# Patient Record
Sex: Female | Born: 1965 | Race: White | Hispanic: No | Marital: Single | Smoking: Never smoker
Health system: Southern US, Community
[De-identification: ages and names within clinical notes are randomized; demographics above are authoritative.]

## PROBLEM LIST (undated history)

## (undated) DIAGNOSIS — M25511 Pain in right shoulder: Secondary | ICD-10-CM

## (undated) DIAGNOSIS — T7840XA Allergy, unspecified, initial encounter: Secondary | ICD-10-CM

## (undated) HISTORY — PX: SHOULDER SURGERY: SHX246

## (undated) HISTORY — PX: FRACTURE SURGERY: SHX138

## (undated) HISTORY — DX: Pain in right shoulder: M25.511

## (undated) HISTORY — DX: Allergy, unspecified, initial encounter: T78.40XA

## (undated) HISTORY — PX: EYE SURGERY: SHX253

## (undated) HISTORY — PX: TUBAL LIGATION: SHX77

---

## 1998-11-03 ENCOUNTER — Observation Stay (HOSPITAL_COMMUNITY): Admission: RE | Admit: 1998-11-03 | Discharge: 1998-11-04 | Payer: Self-pay | Admitting: Gynecology

## 2001-04-03 ENCOUNTER — Other Ambulatory Visit: Admission: RE | Admit: 2001-04-03 | Discharge: 2001-04-03 | Payer: Self-pay | Admitting: Obstetrics and Gynecology

## 2002-05-22 ENCOUNTER — Encounter: Payer: Self-pay | Admitting: Emergency Medicine

## 2002-05-22 ENCOUNTER — Emergency Department (HOSPITAL_COMMUNITY): Admission: EM | Admit: 2002-05-22 | Discharge: 2002-05-22 | Payer: Self-pay | Admitting: Emergency Medicine

## 2002-08-24 ENCOUNTER — Ambulatory Visit (HOSPITAL_BASED_OUTPATIENT_CLINIC_OR_DEPARTMENT_OTHER): Admission: RE | Admit: 2002-08-24 | Discharge: 2002-08-24 | Payer: Self-pay | Admitting: Orthopaedic Surgery

## 2002-10-05 ENCOUNTER — Ambulatory Visit (HOSPITAL_BASED_OUTPATIENT_CLINIC_OR_DEPARTMENT_OTHER): Admission: RE | Admit: 2002-10-05 | Discharge: 2002-10-05 | Payer: Self-pay | Admitting: Orthopaedic Surgery

## 2004-04-15 ENCOUNTER — Inpatient Hospital Stay (HOSPITAL_COMMUNITY): Admission: EM | Admit: 2004-04-15 | Discharge: 2004-04-16 | Payer: Self-pay | Admitting: Emergency Medicine

## 2004-04-16 ENCOUNTER — Encounter (INDEPENDENT_AMBULATORY_CARE_PROVIDER_SITE_OTHER): Payer: Self-pay | Admitting: *Deleted

## 2004-06-06 ENCOUNTER — Ambulatory Visit: Payer: Self-pay | Admitting: Internal Medicine

## 2004-08-28 ENCOUNTER — Ambulatory Visit: Payer: Self-pay | Admitting: Family Medicine

## 2004-08-31 ENCOUNTER — Ambulatory Visit: Payer: Self-pay | Admitting: Family Medicine

## 2004-09-25 ENCOUNTER — Ambulatory Visit: Payer: Self-pay | Admitting: Family Medicine

## 2004-09-25 ENCOUNTER — Encounter (INDEPENDENT_AMBULATORY_CARE_PROVIDER_SITE_OTHER): Payer: Self-pay | Admitting: Internal Medicine

## 2004-09-25 ENCOUNTER — Other Ambulatory Visit: Admission: RE | Admit: 2004-09-25 | Discharge: 2004-10-10 | Payer: Self-pay | Admitting: Internal Medicine

## 2004-09-25 LAB — CONVERTED CEMR LAB: Pap Smear: NORMAL

## 2004-09-27 ENCOUNTER — Ambulatory Visit: Payer: Self-pay | Admitting: Family Medicine

## 2004-11-06 ENCOUNTER — Ambulatory Visit: Payer: Self-pay | Admitting: Family Medicine

## 2004-12-18 ENCOUNTER — Ambulatory Visit: Payer: Self-pay | Admitting: Family Medicine

## 2005-02-08 ENCOUNTER — Encounter (INDEPENDENT_AMBULATORY_CARE_PROVIDER_SITE_OTHER): Payer: Self-pay | Admitting: *Deleted

## 2005-02-08 ENCOUNTER — Ambulatory Visit (HOSPITAL_COMMUNITY): Admission: RE | Admit: 2005-02-08 | Discharge: 2005-02-08 | Payer: Self-pay | Admitting: Obstetrics and Gynecology

## 2005-04-05 ENCOUNTER — Ambulatory Visit: Payer: Self-pay | Admitting: Family Medicine

## 2005-07-02 ENCOUNTER — Ambulatory Visit: Payer: Self-pay | Admitting: Family Medicine

## 2005-07-18 ENCOUNTER — Ambulatory Visit: Payer: Self-pay | Admitting: Family Medicine

## 2006-07-22 HISTORY — PX: CHOLECYSTECTOMY: SHX55

## 2007-02-10 ENCOUNTER — Encounter (INDEPENDENT_AMBULATORY_CARE_PROVIDER_SITE_OTHER): Payer: Self-pay | Admitting: Internal Medicine

## 2007-02-10 DIAGNOSIS — N809 Endometriosis, unspecified: Secondary | ICD-10-CM | POA: Insufficient documentation

## 2007-02-13 ENCOUNTER — Ambulatory Visit: Payer: Self-pay | Admitting: Family Medicine

## 2007-02-13 DIAGNOSIS — F411 Generalized anxiety disorder: Secondary | ICD-10-CM | POA: Insufficient documentation

## 2007-03-20 ENCOUNTER — Ambulatory Visit: Payer: Self-pay | Admitting: Internal Medicine

## 2007-05-21 ENCOUNTER — Ambulatory Visit: Payer: Self-pay | Admitting: Family Medicine

## 2007-05-27 ENCOUNTER — Encounter (INDEPENDENT_AMBULATORY_CARE_PROVIDER_SITE_OTHER): Payer: Self-pay | Admitting: Internal Medicine

## 2007-05-27 ENCOUNTER — Telehealth (INDEPENDENT_AMBULATORY_CARE_PROVIDER_SITE_OTHER): Payer: Self-pay | Admitting: Internal Medicine

## 2007-08-06 ENCOUNTER — Ambulatory Visit: Payer: Self-pay | Admitting: Family Medicine

## 2007-08-06 DIAGNOSIS — R519 Headache, unspecified: Secondary | ICD-10-CM | POA: Insufficient documentation

## 2007-08-06 DIAGNOSIS — R51 Headache: Secondary | ICD-10-CM

## 2007-08-20 ENCOUNTER — Ambulatory Visit: Payer: Self-pay | Admitting: Family Medicine

## 2007-09-07 ENCOUNTER — Telehealth (INDEPENDENT_AMBULATORY_CARE_PROVIDER_SITE_OTHER): Payer: Self-pay | Admitting: Internal Medicine

## 2007-11-03 ENCOUNTER — Telehealth (INDEPENDENT_AMBULATORY_CARE_PROVIDER_SITE_OTHER): Payer: Self-pay | Admitting: Internal Medicine

## 2008-01-05 ENCOUNTER — Ambulatory Visit: Payer: Self-pay | Admitting: Family Medicine

## 2008-01-07 ENCOUNTER — Telehealth (INDEPENDENT_AMBULATORY_CARE_PROVIDER_SITE_OTHER): Payer: Self-pay | Admitting: Internal Medicine

## 2008-01-21 ENCOUNTER — Ambulatory Visit: Payer: Self-pay | Admitting: Internal Medicine

## 2008-01-21 ENCOUNTER — Telehealth (INDEPENDENT_AMBULATORY_CARE_PROVIDER_SITE_OTHER): Payer: Self-pay | Admitting: Internal Medicine

## 2008-11-16 ENCOUNTER — Ambulatory Visit: Payer: Self-pay | Admitting: Family Medicine

## 2008-11-16 ENCOUNTER — Encounter (INDEPENDENT_AMBULATORY_CARE_PROVIDER_SITE_OTHER): Payer: Self-pay | Admitting: Internal Medicine

## 2008-12-21 ENCOUNTER — Encounter (INDEPENDENT_AMBULATORY_CARE_PROVIDER_SITE_OTHER): Payer: Self-pay | Admitting: Internal Medicine

## 2009-01-11 ENCOUNTER — Encounter (INDEPENDENT_AMBULATORY_CARE_PROVIDER_SITE_OTHER): Payer: Self-pay | Admitting: Internal Medicine

## 2010-03-07 ENCOUNTER — Emergency Department (HOSPITAL_COMMUNITY): Admission: EM | Admit: 2010-03-07 | Discharge: 2010-03-07 | Payer: Self-pay | Admitting: Emergency Medicine

## 2010-03-16 ENCOUNTER — Ambulatory Visit: Payer: Self-pay | Admitting: Internal Medicine

## 2010-03-16 DIAGNOSIS — S0100XA Unspecified open wound of scalp, initial encounter: Secondary | ICD-10-CM | POA: Insufficient documentation

## 2010-03-16 DIAGNOSIS — S8010XA Contusion of unspecified lower leg, initial encounter: Secondary | ICD-10-CM | POA: Insufficient documentation

## 2010-04-17 ENCOUNTER — Ambulatory Visit: Payer: Self-pay | Admitting: Internal Medicine

## 2010-04-17 DIAGNOSIS — R42 Dizziness and giddiness: Secondary | ICD-10-CM | POA: Insufficient documentation
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
## 2010-12-07 NOTE — Op Note (Signed)
   NAME:  Leslie Mcguire, Leslie Mcguire                       ACCOUNT NO.:  [id number]   MEDICAL RECORD NO.:  [id number]                   PATIENT TYPE:  AMB   LOCATION:  DSC                                  FACILITY:  [facility]   PHYSICIAN:  Lubertha Basque. Jerl Santos, M.D.             DATE OF BIRTH:  05-01-1966   DATE OF PROCEDURE:  10/05/2002  DATE OF DISCHARGE:                                 OPERATIVE REPORT   PREOPERATIVE DIAGNOSES:  Left shoulder adhesive capsulitis.   POSTOPERATIVE DIAGNOSES:  Left shoulder adhesive capsulitis.   OPERATION PERFORMED:  Left shoulder closed manipulation.   SURGEON:  Lubertha Basque. Jerl Santos, M.D.   ASSISTANT:  Prince Rome, P.A.   ANESTHESIA:  General mask and block.   INDICATIONS FOR PROCEDURE:  The patient is a 45 year old woman sometime out  from an open partial claviculectomy.  She has done well in terms of  eliminating her pain at the surgical site, but unfortunately has been left  with a very stiff shoulder.  This has not improved despite physical therapy  and she is offered a closed manipulation at this point.  The procedure was  discussed with the patient and informed operative consent was obtained after  discussion of possible complications of reaction to anesthesia and fracture.   DESCRIPTION OF PROCEDURE:  The patient was taken to the operating suite  where she was given a mask anesthetic.  She was also given a block in the  preanesthesia area.  I then performed a manipulation.  I took her forward  flexion from about 90 to 150 with several audible pops along the way.  Her  external rotation improved from 0 to about 50 degrees and internal rotation  was also improved.  I then prepped her shoulder and we placed a small  injection of Depo-Medrol and lidocaine.  She tolerated this well and a band-  aid was applied.   ESTIMATED BLOOD LOSS:  None.   Intraoperative fluids can be obtained from anesthesia records.   DISPOSITION:  The patient was taken to  the recovery room in stable  condition.  Plans were for the patient  to go home the same day and to  follow up in the office in less than a week.  I will contact her by phone  tonight.                                                 Lubertha Basque Jerl Santos, M.D.    PGD/MEDQ  D:  10/05/2002  T:  10/05/2002  Job:  [id number]

## 2010-12-07 NOTE — H&P (Signed)
Leslie Mcguire             ACCOUNT NO.:  [id number]   MEDICAL RECORD NO.:  [id number]          PATIENT TYPE:  AMB   LOCATION:  SDC                           FACILITY:  [facility]   PHYSICIAN:  Lenoard Aden, M.D.DATE OF BIRTH:  [birth year]   DATE OF ADMISSION:  DATE OF DISCHARGE:                                HISTORY & PHYSICAL   A 45 year old white female, G0, P0, with history of menorrhagia,  dysmenorrhea.  She has a history of endometriosis status post laparoscopic  right salpingo-oophorectomy and tubal ligation.  She wishes to proceed with  definitive evaluation.  She has a history of endometriosis and surgical  intervention as previously noted.  No pregnancies.  She is a nonsmoker,  nondrinker, denies domestic or physical violence.  She also has a history of  cataract surgery.  She has a family history of hypertension, heart disease,  and lung cancer.   ALLERGIES:  PENICILLIN.   MEDICATIONS:  Multivitamin.   PHYSICAL EXAMINATION:  GENERAL:  She is a well-developed, well-nourished,  white female in no acute distress.  HEENT:  Normal.  LUNGS:  Clear.  HEART:  Regular rate and rhythm.  ABDOMEN:  Soft, nontender.  PELVIC:  An anteflexed uterus and no adnexal masses.   IMPRESSION:  Menorrhagia, dysmenorrhea for definitive therapy.   PLAN:  Proceed with a diagnostic hysteroscopy, possible resectoscope, and  endometrial ablation.  Risks of anesthesia, infection, bleeding, uterine  perforation, possible need for open procedure noted.  The patient  acknowledges and wishes to proceed.       RJT/MEDQ  D:  02/07/2005  T:  02/07/2005  Job:  [id number]

## 2010-12-07 NOTE — Op Note (Signed)
NAMERHAYA, COALE NO.:  [id number]   MEDICAL RECORD NO.:  [id number]          PATIENT TYPE:  INP   LOCATION:  0467                         FACILITY:  [facility]   PHYSICIAN:  Ollen Gross. Vernell Morgans, M.D. DATE OF BIRTH:  1965-08-19   DATE OF PROCEDURE:  04/15/2004  DATE OF DISCHARGE:  04/16/2004                                 OPERATIVE REPORT   PREOPERATIVE DIAGNOSIS:  Cholecystitis with cholelithiasis.   POSTOPERATIVE DIAGNOSIS:  Cholecystitis with cholelithiasis.   PROCEDURE:  Laparoscopic cholecystectomy with intraoperative cholangiogram.   SURGEON:  Ollen Gross. Carolynne Edouard, M.D.   ASSISTANT:  Adolph Pollack, M.D.   ANESTHESIA:  General endotracheal.   PROCEDURE:  After informed consent was obtained, the patient was brought to  the operating room and placed in a supine position on the operating room  table.  After adequate induction of general anesthesia, the patient's  abdomen was prepped with Betadine and draped in the usual sterile manner.  The area below the umbilicus was infiltrated with 0.25% Marcaine.  A small  incision was made with a 15 blade knife.  This incision was carried down  through the subcutaneous tissue bluntly with a Kelly clamp and [employer]  retractors until the linea alba was identified.  The linea alba was incised  with a 15 blade knife, and each side was grasped with Kocher clamps and  elevated anteriorly.  The pre-peritoneal space was then probed bluntly with  a hemostat until the peritoneum was opened, and access was gained to the  abdominal cavity.  A 0 Vicryl purse-string stitch was placed in the fascial  surrounding the opening.  A Hasson cannula was placed through the opening  and anchored in place with the previously placed Vicryl purse-string stitch.  The laparoscope was then placed through the Hasson cannula and anchored in  place with the previously placed Vicryl purse-string suture.  The abdomen  was then insufflated with carbon  dioxide without difficulty.  A laparoscope  was then placed through the Hasson cannula, and the right upper quadrant was  inspected.  The dome of the gallbladder and liver were readily identified.  Next, the epigastric region was infiltrated with 0.25% Marcaine.  A small  incision was made with a 15 blade knife, and a 10 mm port was placed bluntly  through this incision into the abdominal cavity under direct vision.  Sites  were chosen laterally on the right side of the abdomen with placement of 5  mm ports.  Each of these areas were infiltrated with 0.25% Marcaine.  Small  stab incisions were made with a 15 blade knife, and 5 mm ports were placed  bluntly through these incisions into the abdominal cavity under direct  vision.  A blunt grasper was placed through the lateral-most 5 mm port and  used to grasp the dome of the gallbladder and elevate it anteriorly and  superiorly.  Another blunt grasper was placed through the other 5 mm port  and used to retract on the body and neck of the gallbladder.  A dissection  was placed through the epigastric port using electrocautery.  The peritoneal  reflection was opened at the gallbladder neck and cystic duct  junction  area.  Blunt dissection was then carried out in this area until the  gallbladder neck and cystic duct junction was readily identified, and a good  window was created.  A single clip was placed on the gallbladder neck.  A  small ductotomy was made just below the clip.  A 14 gauge angiocath was then  placed percutaneously through the anterior abdominal wall under direct  vision.  A Reddick cholangiogram catheter was then placed through the  angiocath and flushed.  The Reddick catheter was placed within the cystic  duct and anchored in place with a clip.  A cholangiogram was obtained that  showed no filling defects, good emptying into the duodenum, and an adequate  length on the cystic duct, although it did look a little short.  The   anchoring clips and catheters were then removed from the patient.  Two clips  were placed proximally on the cystic duct, and the duct was divided between  the two sets of clips.  Posterior to this, the cystic artery was identified  and again dissected bluntly in a circumferential manner until a good window  was created.  Two clips were placed proximally and one distally on the  artery, and the artery was divided between the two.  Next, the laparoscopic  hook cautery device was used to separate the gallbladder from the liver bed  prior to completely detaching the gallbladder from the liver bed.  The liver  bed was inspected, and several small bleeding points were coagulated with  the electrocautery until the area was completely hemostatic.  Next, a  laparoscopic bag was placed through the epigastric port, and the gallbladder  was placed within the bag, and the bag was sealed.  The abdomen was then  irrigated with copious amounts of saline until the effluent was clear.  The  liver bed was inspected again and found to be hemostatic.  The laparoscope  was then moved to the epigastric port, and the gallbladder grasper was  placed through the Hasson cannula  and used to grasp the neck of the bag.  The bag with the gallbladder was then removed through the infraumbilical  port without difficulty.  The fascial defect was closed with the previously  placed Vicryl purse-string stitch as well as another interrupted 0 Vicryl  stitch.  The rest of the ports were removed under direct vision and were  found to be hemostatic.  The gas was allowed to escape.  The skin incisions  were all closed with interrupted 4-0 Monocryl subcuticular stitches.  Benzoin, Steri-Strips, and sterile dressings were applied.  The patient  tolerated the procedure well.  At the end of the case, all needle, sponge,  and instrument counts were correct.  Patient was then awakened and taken to the recovery room in stable  condition.      PST/MEDQ  D:  04/16/2004  T:  04/16/2004  Job:  [id number]

## 2010-12-07 NOTE — Op Note (Signed)
NAMEKEONA, Leslie Mcguire             ACCOUNT NO.:  [id number]   MEDICAL RECORD NO.:  [id number]          PATIENT TYPE:  AMB   LOCATION:  SDC                           FACILITY:  [facility]   PHYSICIAN:  Lenoard Aden, M.D.DATE OF BIRTH:  January 06, 1966   DATE OF PROCEDURE:  02/08/2005  DATE OF DISCHARGE:                                 OPERATIVE REPORT   PREOPERATIVE DIAGNOSIS:  Menometrorrhagia, dysmenorrhea, dyspareunia.   POSTOPERATIVE DIAGNOSIS:  Endometrial polyp.   OPERATION/PROCEDURE:  1.  Diagnostic hysteroscopy.  2.  Endometrial polypectomy.  3.  Dilatation and curettage.  4.  NovaSure endometrial ablation.   SURGEON:  Lenoard Aden, M.D.   ANESTHESIA:  General.   ESTIMATED BLOOD LOSS:  Less than 50 mL.   COMPLICATIONS:  None.   FLUID DEFICIT:  60 mL.   DISPOSITION:  The patient to recovery in good condition.   DESCRIPTION OF PROCEDURE:  After being apprised of the risks of anesthesia,  infection, bleeding, intra-abdominal organs, need for repair, delayed  __________ complications related to uterine perforation and possible need  for repair, inability to cure pelvic pain, the patient was brought to the  operating room where she was administered a general anesthetic without  complications, prepped and draped in the usual sterile fashion.  Catheterized until the bladder was empty.  Examination under anesthesia  showed a small anteflexed uterus and no adnexal masses.  After achieving  adequate anesthesia, dilute Pitressin solution placed at 3 and 9 o'clock at  the cervicovaginal junction, 16 mL total.  At this time measurements were  obtained.  Uterus sounded to 8 cm, intracervical length of 3 cm.  Hysteroscope was placed.  Large endometrial polyp is seen and extracted  using polyp forceps without difficulty.  Good hemostasis noted. Dilatation  and curettage was performed, endometrial curettings collected.  NovaSure  device placed and approximate seating is performed  using appropriate  maneuvers; CO2 seal is negative consistent with a good CO2 seal.  Procedure  is commenced and lasts for a total of 62 seconds.  Revisualization reveals a well-ablated cavity.  No evidence of uterine  perforation.  Normal cornua and normal fundal areas.  Pictures taken.  At  this time fluid deficit is noted to be 60 mL.  All instruments are removed.  The patient tolerated the procedure well and is transferred to recovery in  good condition.       RJT/MEDQ  D:  02/08/2005  T:  02/08/2005  Job:  [id number]

## 2010-12-07 NOTE — H&P (Signed)
NAMECHERYLN, BALCOM NO.:  [id number]   MEDICAL RECORD NO.:  [id number]          PATIENT TYPE:  INP   LOCATION:  0467                         [facility]ACILITY:  [facility]   PHYSICIAN:  Ollen Gross. Vernell Morgans, M.D. DATE OF BIRTH:  December 30, 1965   DATE OF ADMISSION:  04/15/2004  DATE OF DISCHARGE:                                HISTORY & PHYSICAL   HISTORY:  [name] is a 45 year old white female who has been having some  epigastric pain for the last couple months, yesterday she had a severe  episode of the pain, the pain radiated to her back, it was associated with  nausea and vomiting.  She otherwise denied any fevers, chills, chest pain,  shortness of breath, diarrhea, or dysuria.  The rest of her review of  systems is unremarkable.   PAST MEDICAL HISTORY:  Endometriosis.   PAST SURGICAL HISTORY:  Laparoscopy for endometriosis and removal of an  ovary as well as a laparoscopic tubal ligation.   MEDICATIONS:  Her medications include fluoxetine 20 mg a day.   ALLERGIES:  PENICILLIN.   SOCIAL HISTORY:  She denies any tobacco use and only drinks alcohol on the  weekends.   FAMILY HISTORY:  Noncontributory.   PHYSICAL EXAMINATION:  VITAL SIGNS:  She is afebrile with stable vitals.  GENERAL:  She is a well-developed, well-nourished white female in no acute  distress.  SKIN:  Her skin is warm and dry with no jaundice.  EYES:  Her extraocular muscles are intact.  Pupils equal, round, reactive to  light.  Sclerae are nonicteric.  NECK:  Her neck has no bruits.  I cannot palpate thyroid masses.  Trachea is  midline.  LUNGS:  Her lungs are clear bilaterally with no use of accessory respiratory  muscles.  HEART:  Heart has a regular rate and rhythm with an impulse in the left  chest.  ABDOMEN:  Abdomen is soft with some mild epigastric tenderness.  No guarding  or  peritoneal signs.  No palpable mass or hepatosplenomegaly.  EXTREMITIES:  No cyanosis, clubbing, or edema with  good strength in the arms  and legs.  PSYCHOLOGICAL:  She is alert and oriented x3 with no evidence of anxiety or  depression.   LABORATORIES:  On review of her laboratory work it was significant for  normal liver functions, white count was 13.7, her ultrasound showed stones  in her gallbladder, some thickening of her gallbladder wall but no ductal  dilatation.   ASSESSMENT AND PLAN:  This is a 45 year old white female with cholecystitis  with cholelithiasis.  I think she would benefit from having her gallbladder  removed and she would also like to have this done.  I have explained to her  in detail the risks and benefits of the operation to remove the gallbladder  as well as some of the technical aspects and she understands and wishes to  proceed.  We will plan to do this for her this afternoon in the operating  room.      PST/MEDQ  D:  04/15/2004  T:  04/15/2004  Job:  [id number]
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
## 2011-07-29 IMAGING — CR DG HIP (WITH OR WITHOUT PELVIS) 2-3V*L*
3 series · 3 of 3 positions shown · non-contrast
Comparison: None.

CLINICAL DATA: Fall from ladder.  Left pelvic and hip pain.

LEFT HIP - COMPLETE 2+ VIEW

[view not recorded (1 of 3)]
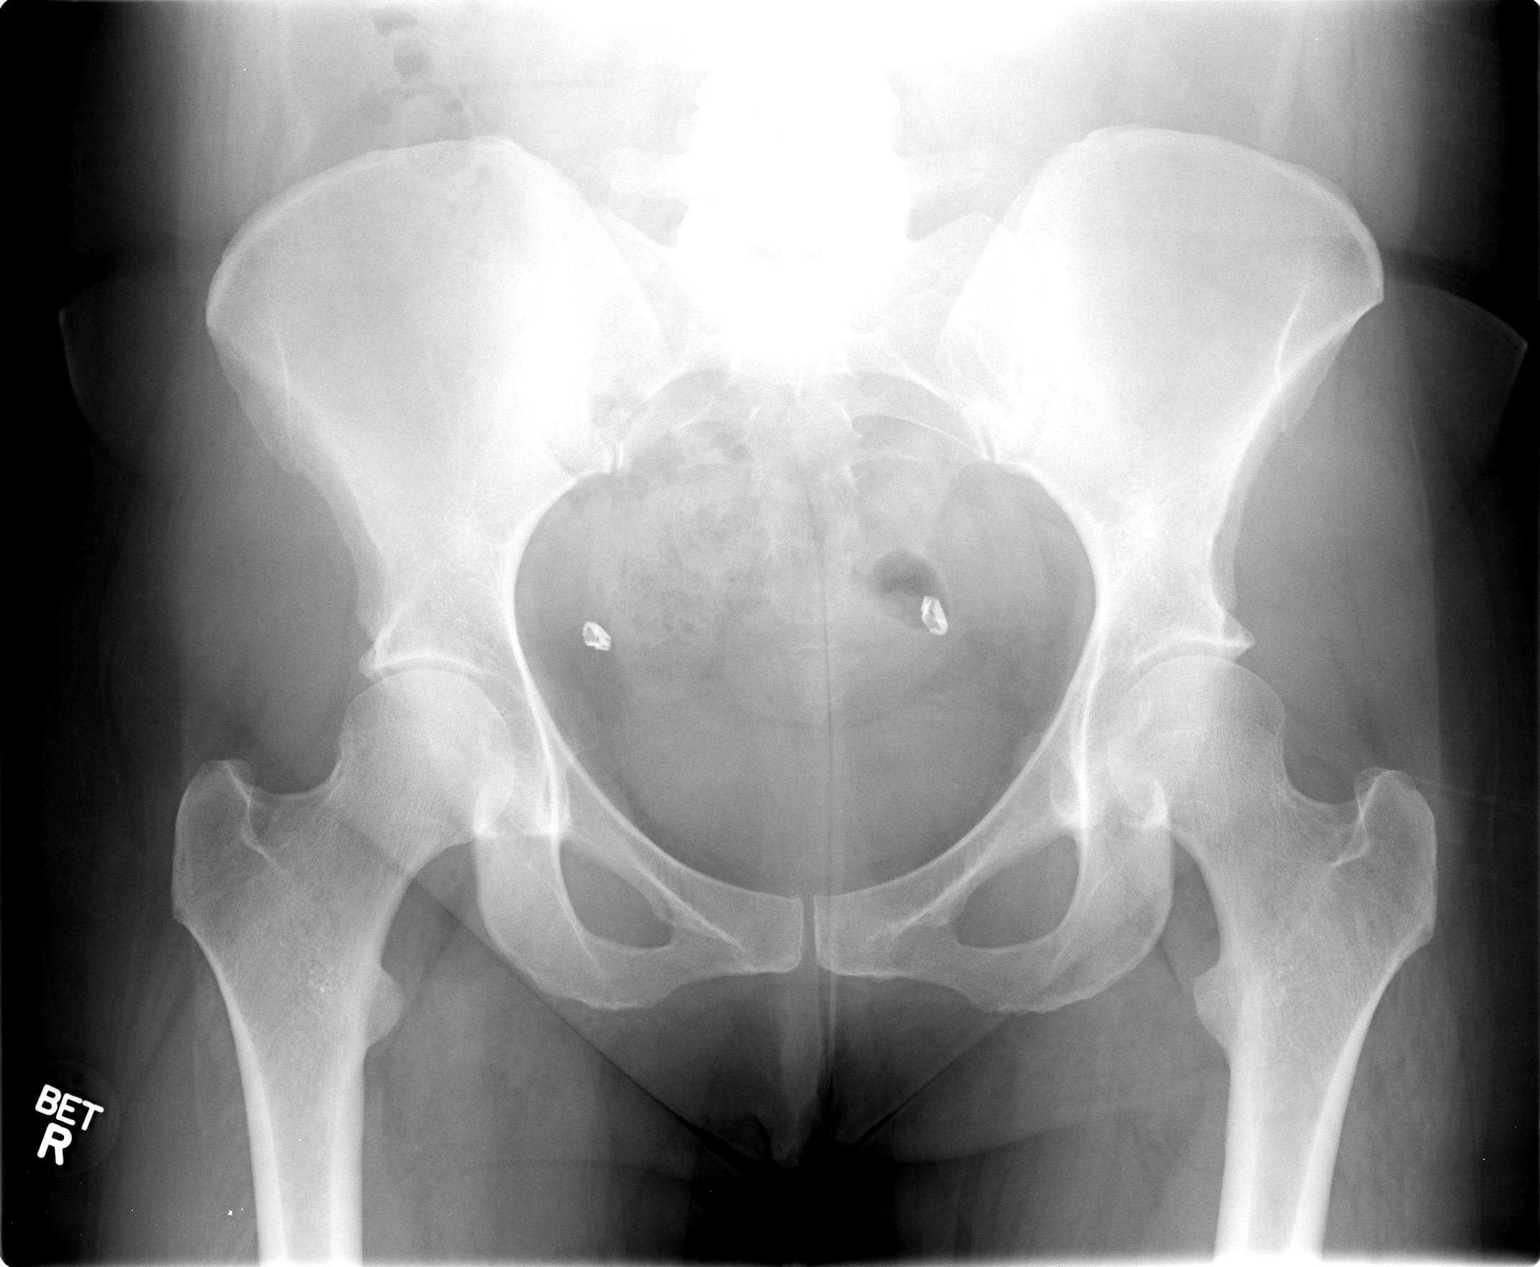

[view not recorded (2 of 3)]
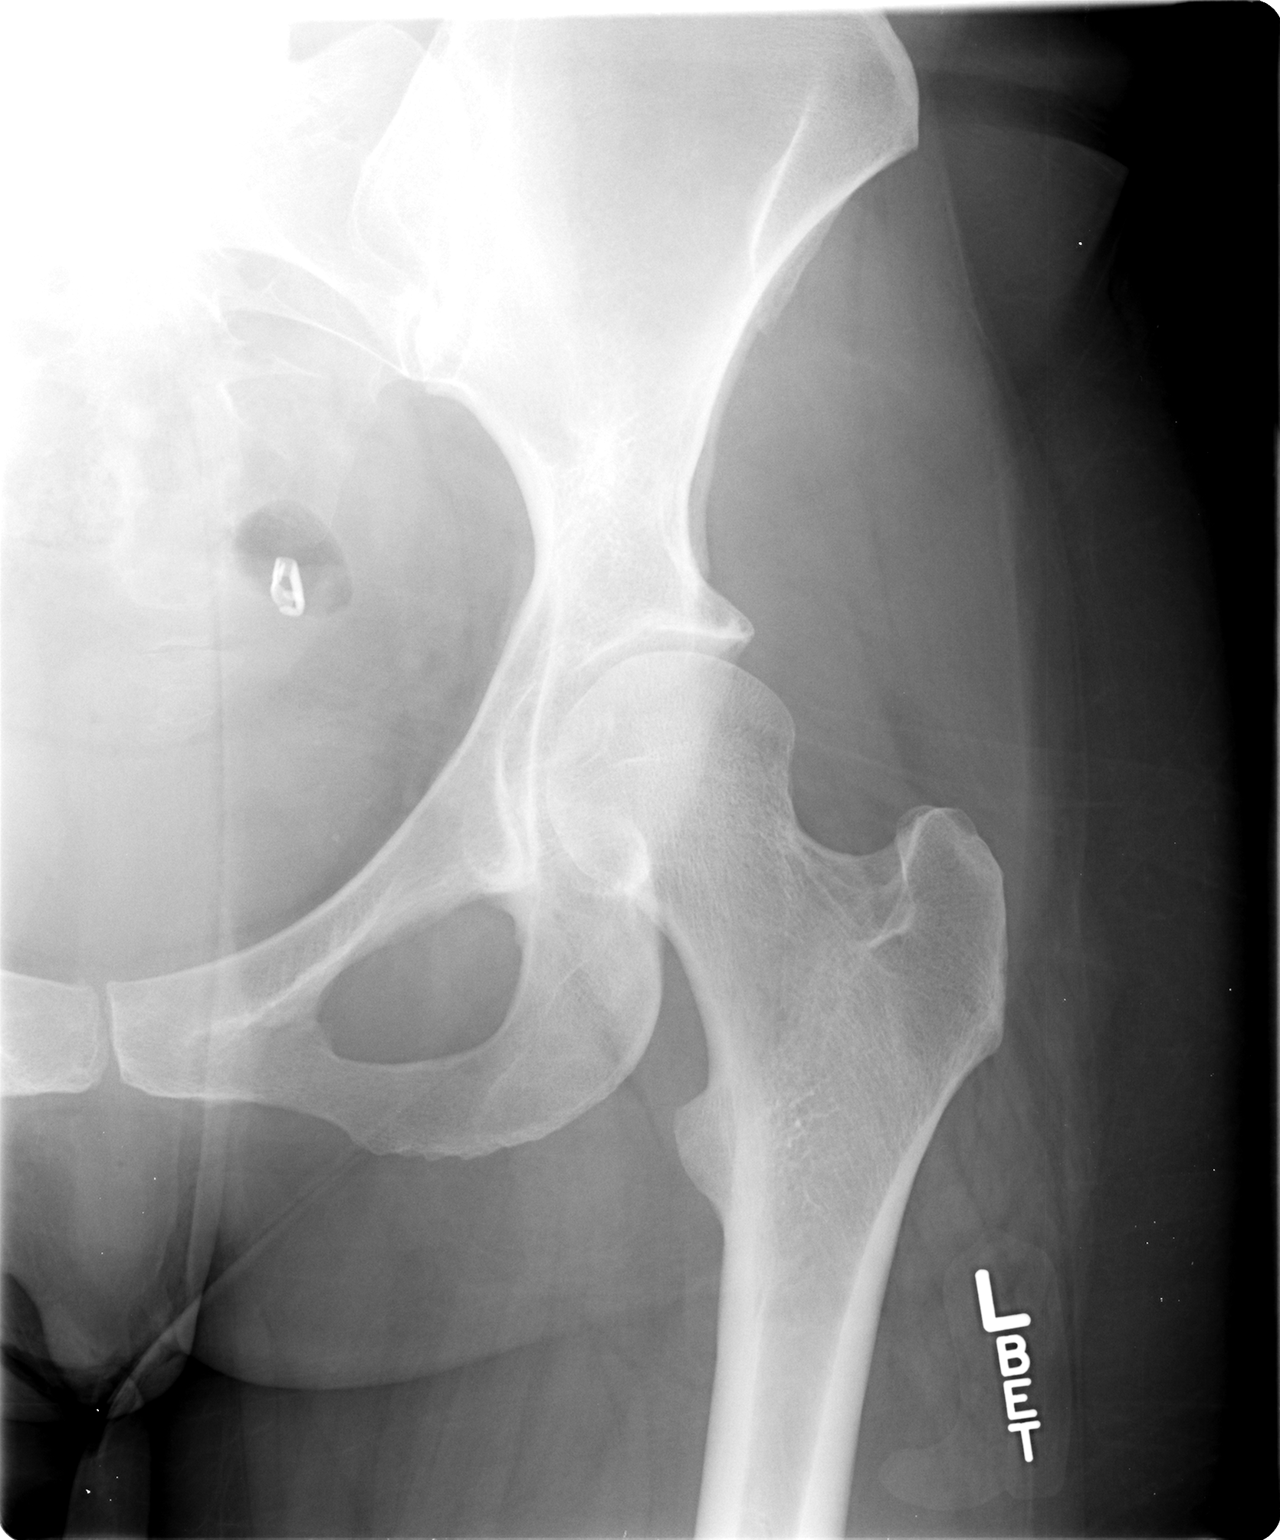

[view not recorded (3 of 3)]
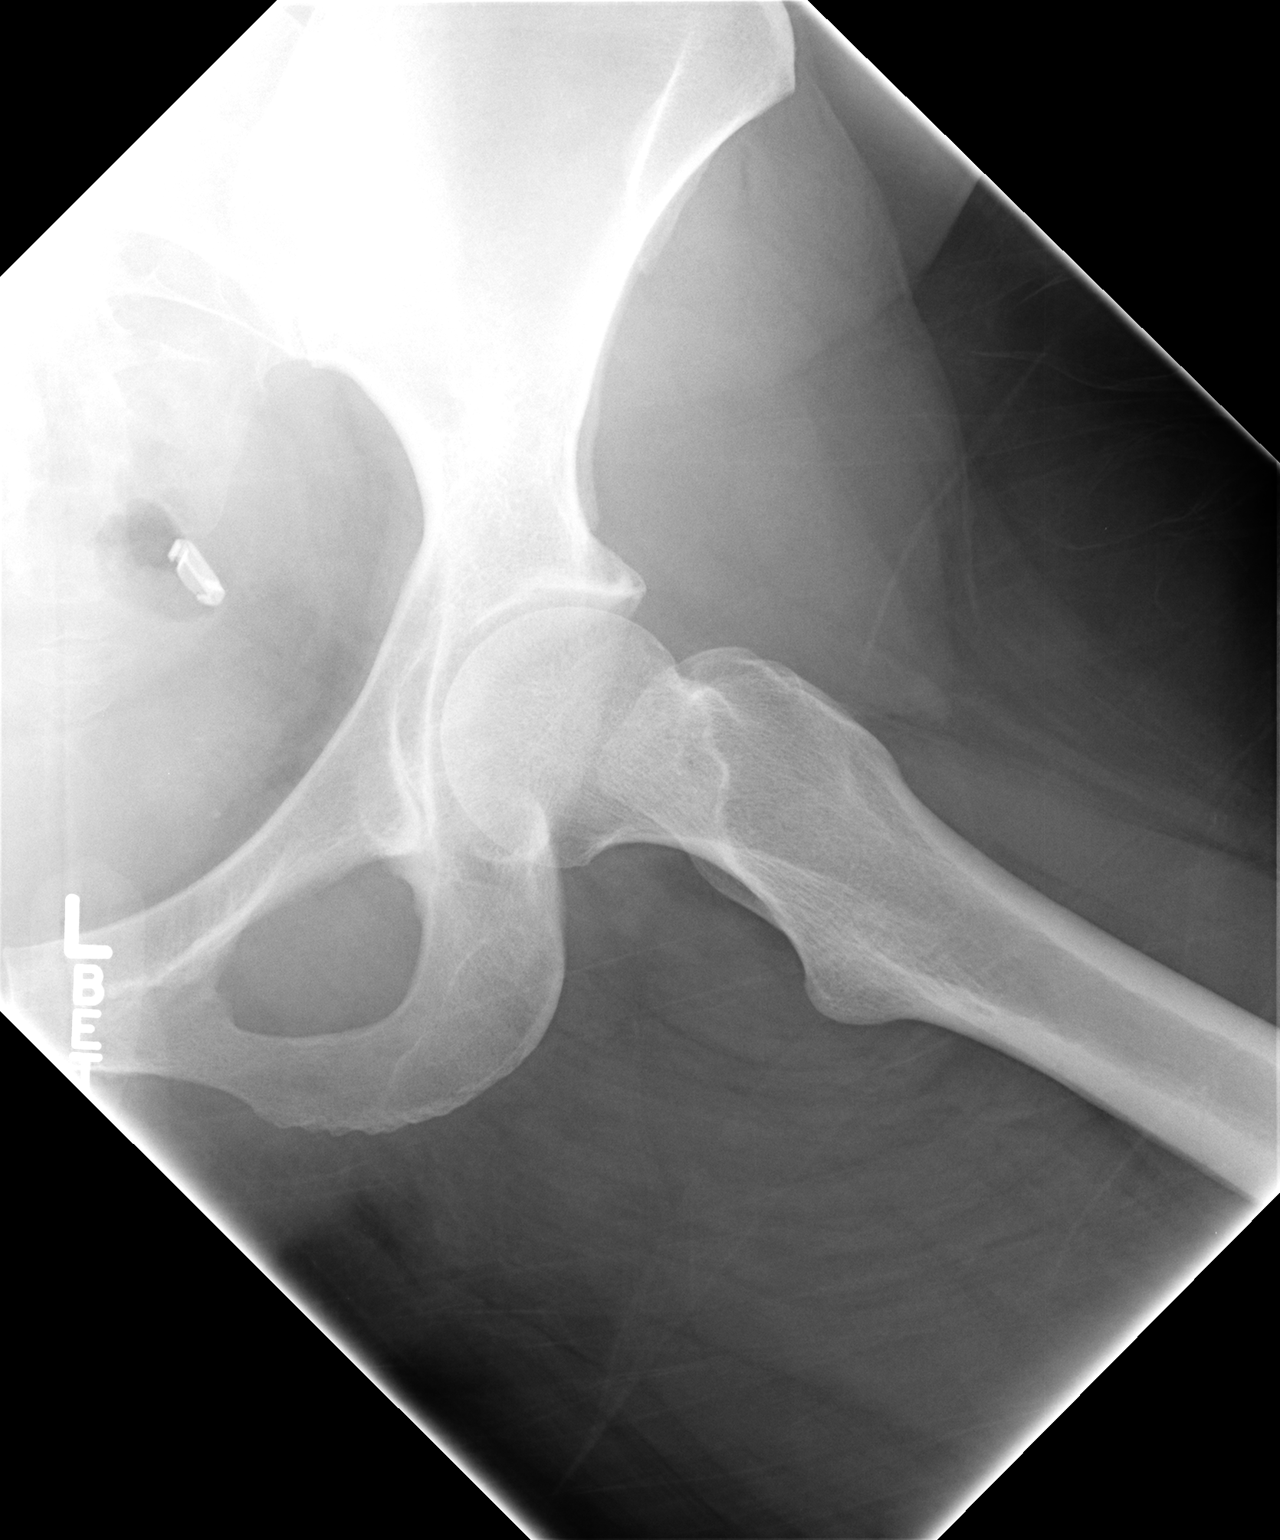

[3 of 3 positions shown; findings below may reference images not displayed]

FINDINGS: Bilateral tubal ligation clips noted.  The arcuate lines
of the sacrum appear continuous.  No hip fracture is readily
apparent.  No acute bony findings.
IMPRESSION: 1.  No acute bony findings.

## 2011-07-29 IMAGING — CR DG TIBIA/FIBULA 2V*L*
2 series · 2 of 2 positions shown · non-contrast
Comparison: None.

CLINICAL DATA: Fall from a ladder.  Leg pain.

LEFT TIBIA AND FIBULA - 2 VIEW

[view not recorded (1 of 2)]
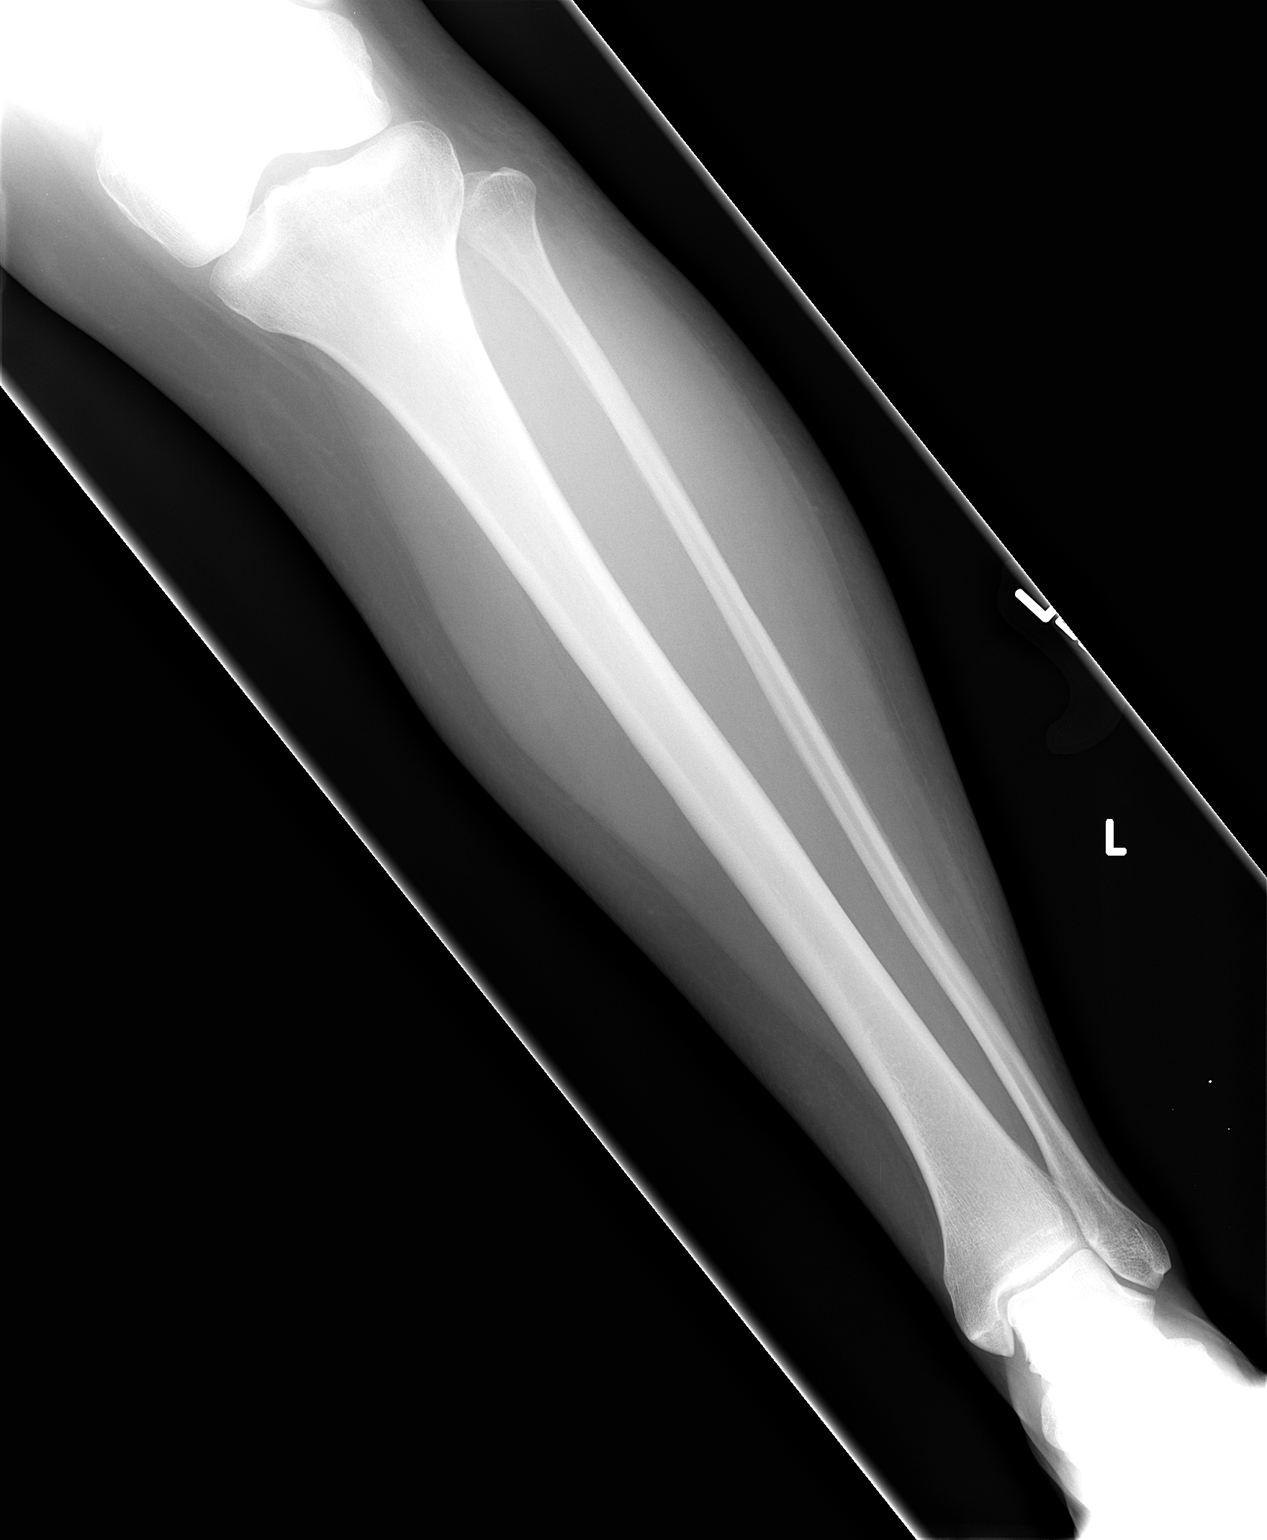

[view not recorded (2 of 2)]
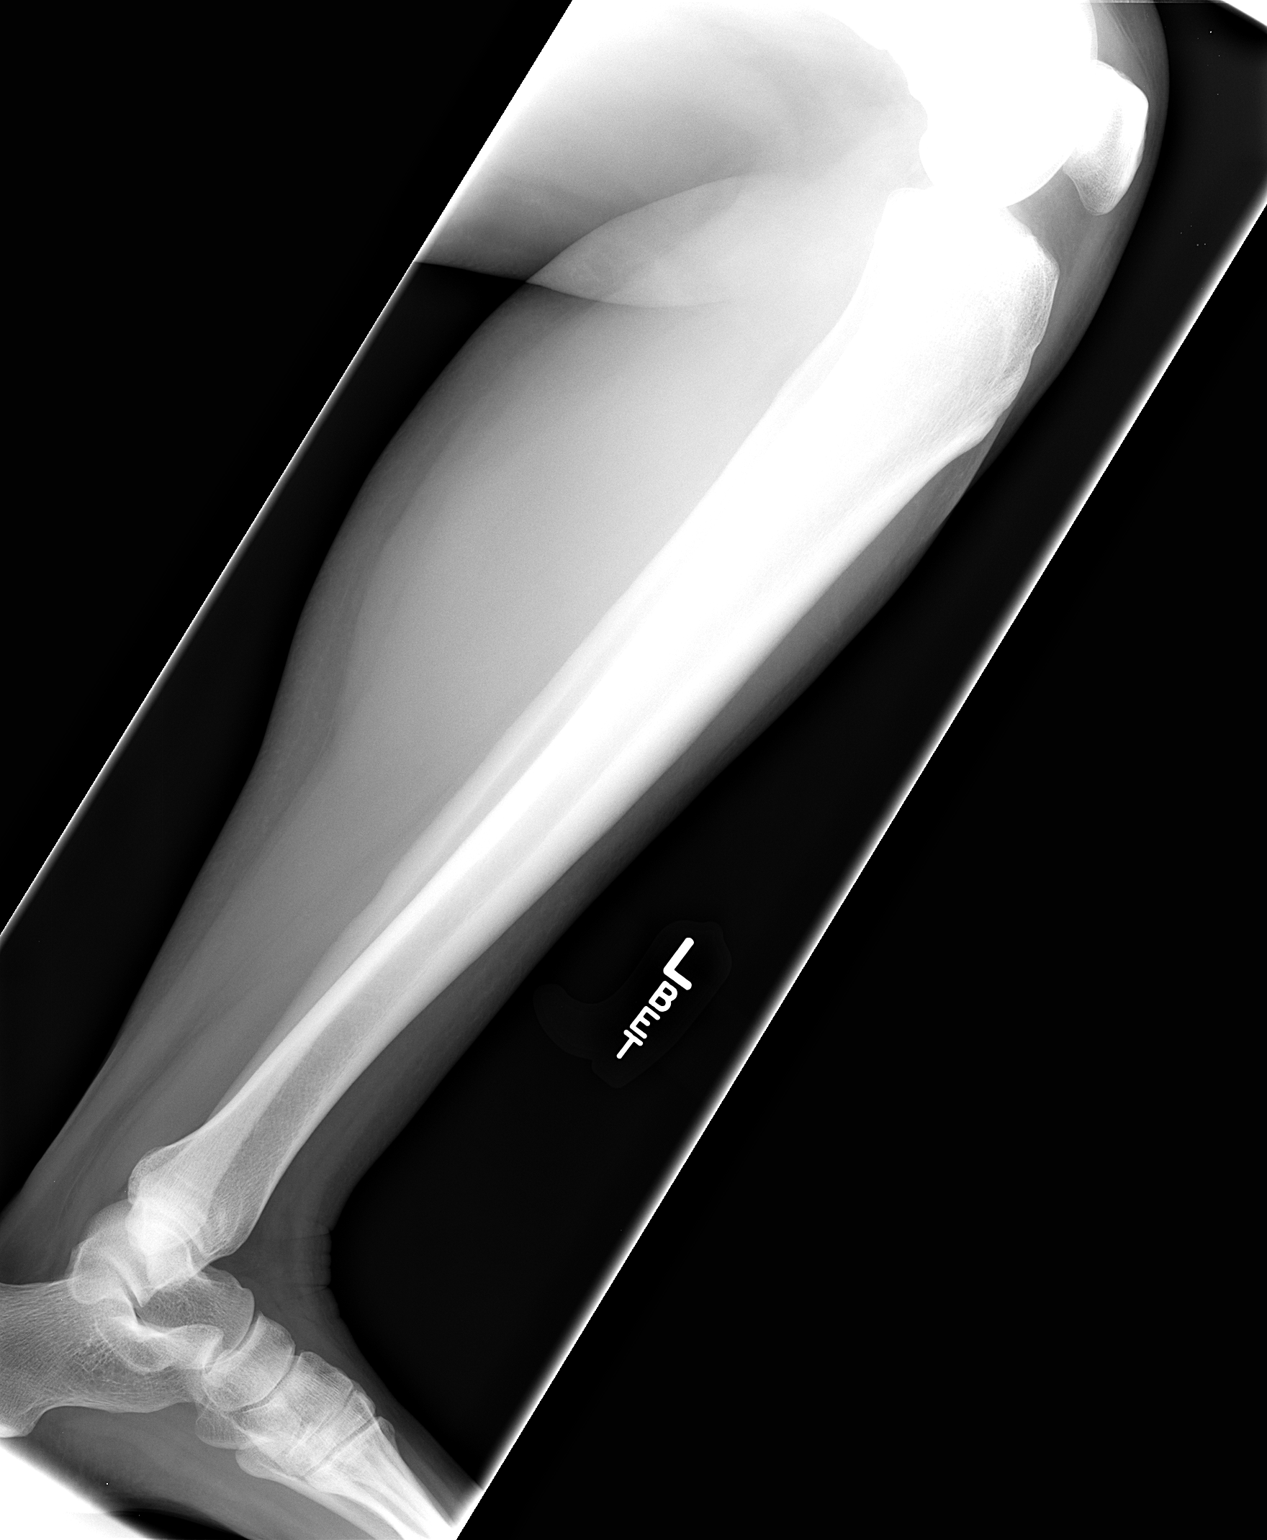

[2 of 2 positions shown; findings below may reference images not displayed]

FINDINGS: No fracture, foreign body, or acute bony findings are
identified.
IMPRESSION: No significant abnormality identified.

## 2011-07-29 IMAGING — CT CT HEAD W/O CM
4 of 5 series · 11 of 47 positions shown, 12 images · non-contrast
Comparison: None available.

CT HEAD

CLINICAL DATA: Fall from a ladder.  Head injury. Abrasions and
laceration along head.  Head pain.

CT HEAD WITHOUT CONTRAST
CT CERVICAL SPINE WITHOUT CONTRAST
TECHNIQUE: Multidetector CT imaging of the head and cervical spine
was performed following the standard protocol without intravenous
contrast.  Multiplanar CT image reconstructions of the cervical
spine were also generated.

[Series 2: headseq 4.8 h37s · axial · 0.47mm/px · z∈[+278,+327]mm · 2 of 30 slices shown, 3 images]
[im 10/30  brain]
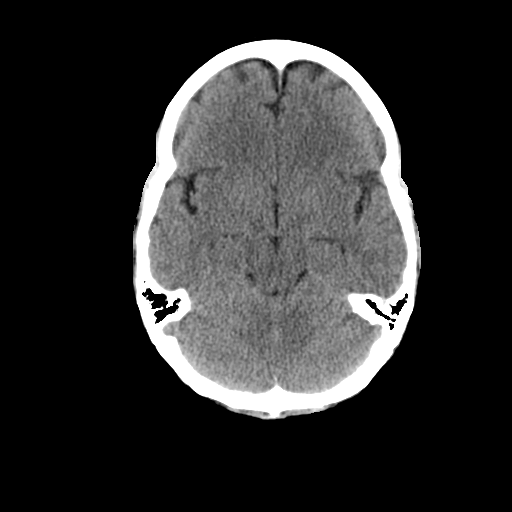
[im 10/30  bone]
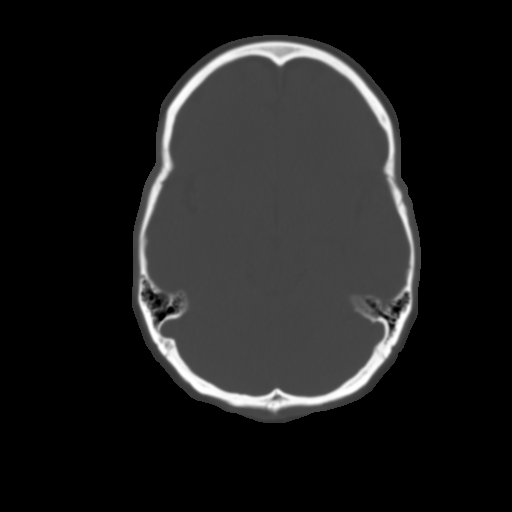
[im 20/30  brain]
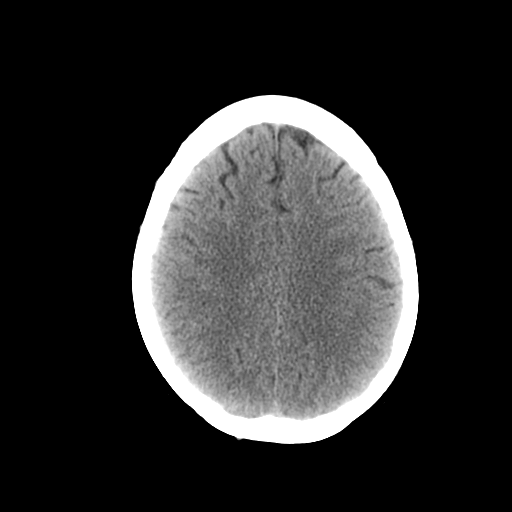

[Series 7: sagittal bone 2.0 · sagittal · 0.17mm/px · 3 of 35 slices shown]
[im 12/35  brain]
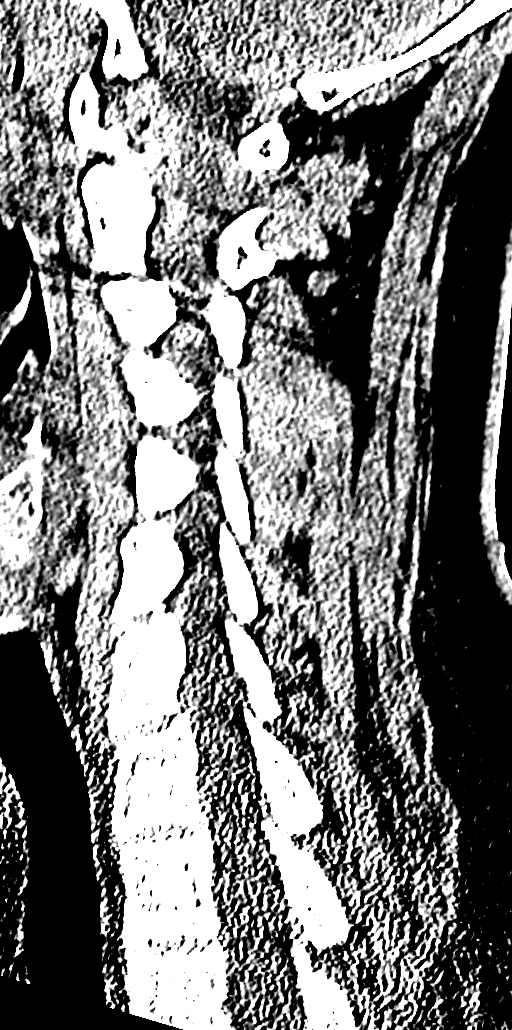
[im 18/35  brain]
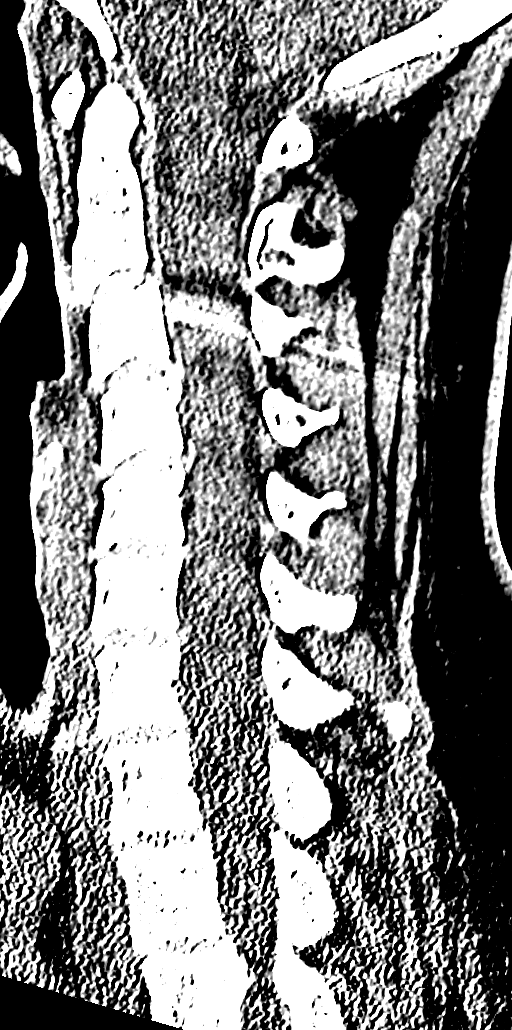
[im 23/35  brain]
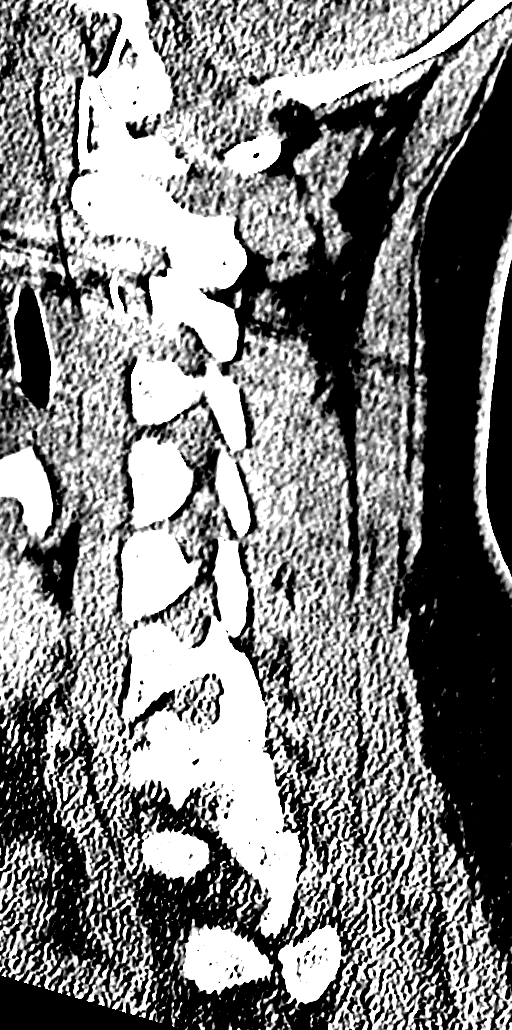

[Series 8: coronal bone 2.0 · coronal · 0.16mm/px · 3 of 40 slices shown]
[im 14/40  brain]
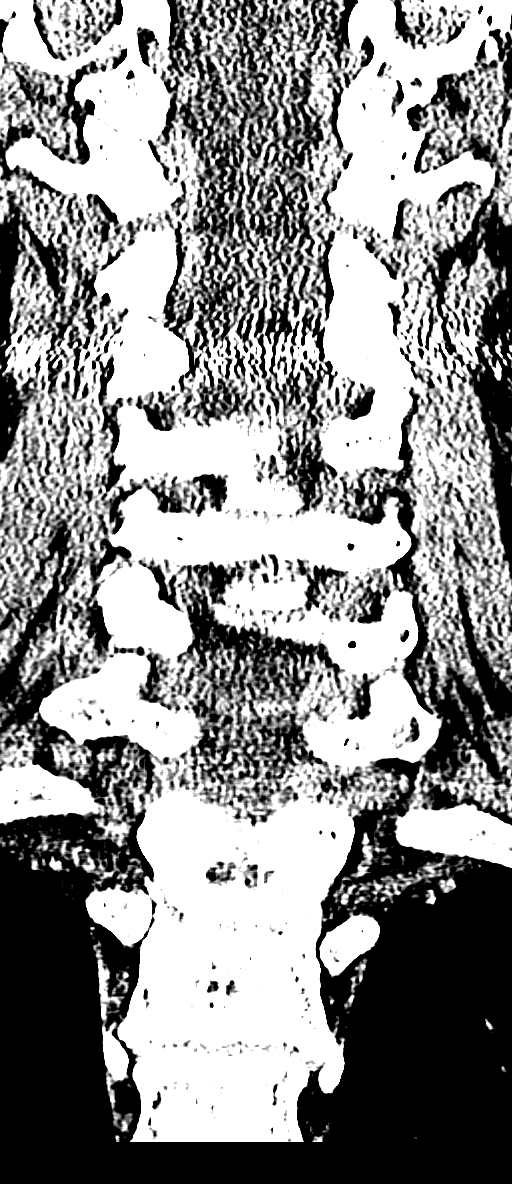
[im 18/40  brain]
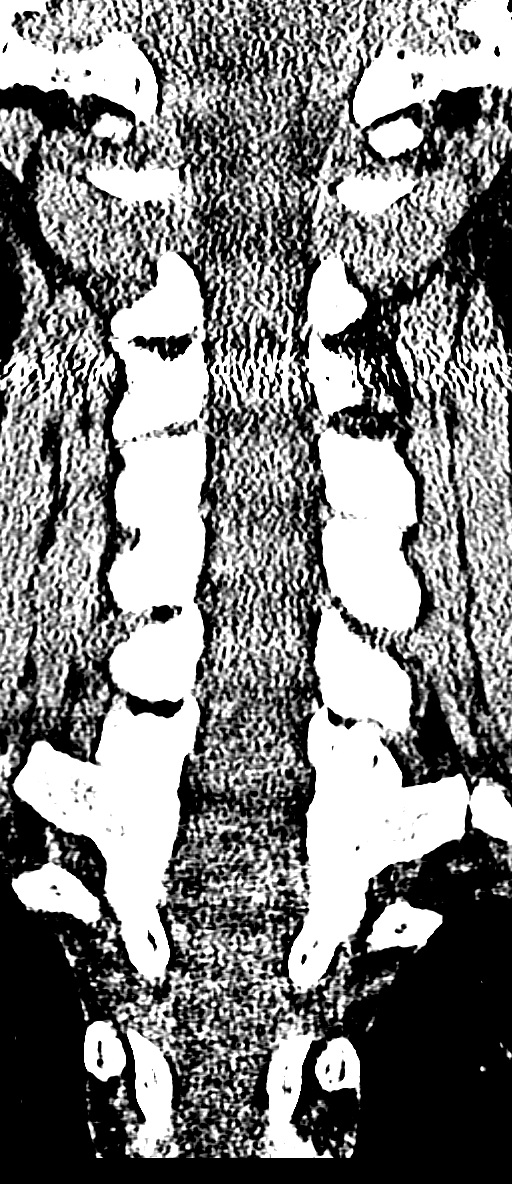
[im 22/40  brain]
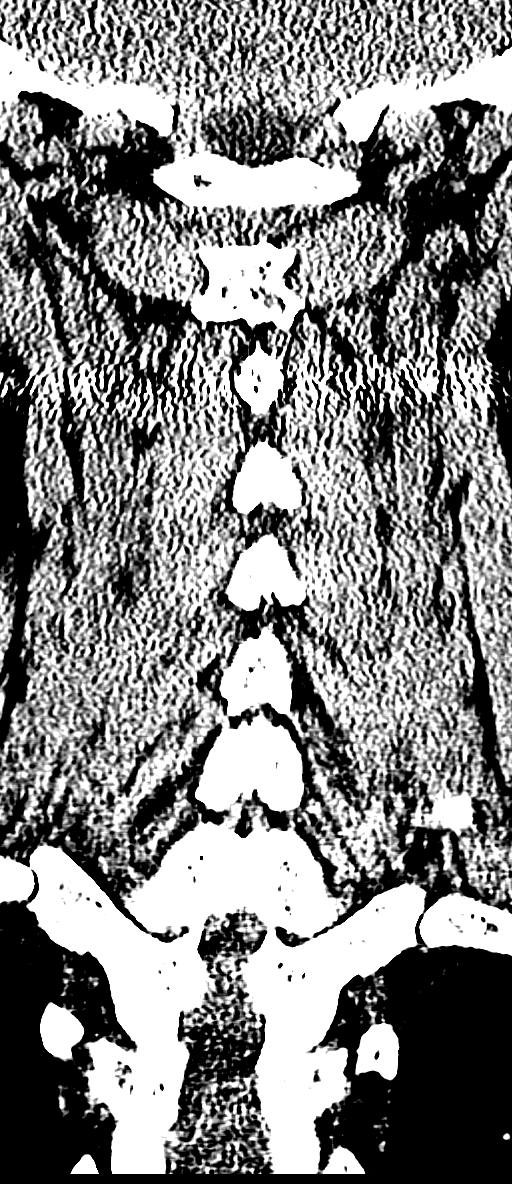

[Series 9: axial bone 2.0 · axial · 0.16mm/px · z∈[+62,+106]mm · 3 of 84 slices shown]
[im 8/84  bone]
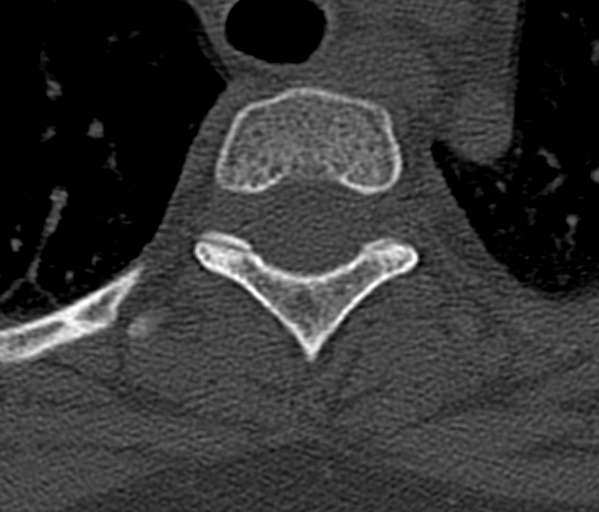
[im 16/84  bone]
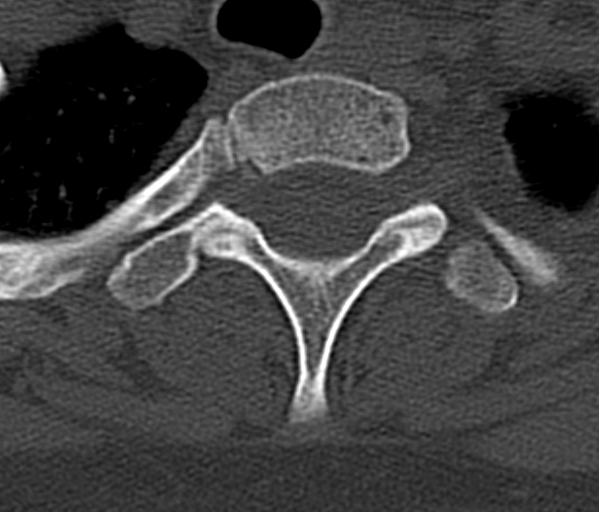
[im 31/84  bone]
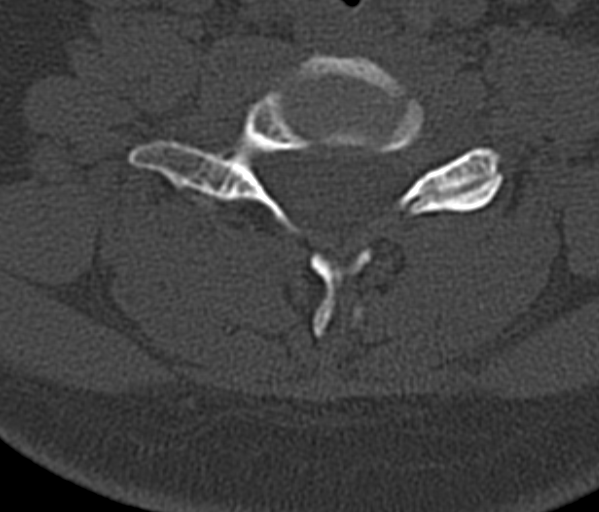

[11 of 47 positions shown; findings below may reference images not displayed]

FINDINGS: The brain stem, cerebellum, cerebral peduncles, thalami,
basal ganglia, basilar cisterns, and ventricular system appear
unremarkable.

No intracranial hemorrhage, mass lesion, or acute infarction is
identified.

Left parietal scalp laceration noted.
IMPRESSION: 1.  Left parietal scalp laceration.   Otherwise, no significant
abnormality identified.

CT CERVICAL SPINE
FINDINGS: There is loss of the normal cervical lordosis and I
suspect that there could be a disc bulge at the C4-5 level.

No prevertebral soft tissue swelling is identified.  No cervical
vertebral malalignment noted.  No cervical spine fracture is
evident.
IMPRESSION: 1.  No cervical spine fracture or subluxation.
2.  Possible mild disc bulge at C4-5.

## 2012-12-08 ENCOUNTER — Ambulatory Visit
Admission: RE | Admit: 2012-12-08 | Discharge: 2012-12-08 | Disposition: A | Discharge status: Home / Self Care | Payer: Worker's Compensation | Source: Ambulatory Visit | Attending: Family Medicine | Admitting: Family Medicine

## 2012-12-08 ENCOUNTER — Other Ambulatory Visit: Payer: Self-pay | Admitting: Family Medicine

## 2012-12-08 ENCOUNTER — Other Ambulatory Visit: Payer: Self-pay | Admitting: Occupational Medicine

## 2012-12-08 DIAGNOSIS — IMO0001 Reserved for inherently not codable concepts without codable children: Secondary | ICD-10-CM

## 2013-09-29 ENCOUNTER — Encounter: Payer: Self-pay | Admitting: Internal Medicine

## 2013-09-29 ENCOUNTER — Ambulatory Visit (INDEPENDENT_AMBULATORY_CARE_PROVIDER_SITE_OTHER): Payer: BC Managed Care – PPO | Admitting: Internal Medicine

## 2013-09-29 ENCOUNTER — Ambulatory Visit: Payer: Self-pay | Admitting: Internal Medicine

## 2013-09-29 VITALS — BP 122/78 | HR 74 | Temp 98.9°F | Ht 63.25 in | Wt 168.8 lb

## 2013-09-29 DIAGNOSIS — Z23 Encounter for immunization: Secondary | ICD-10-CM

## 2013-09-29 DIAGNOSIS — Z0000 Encounter for general adult medical examination without abnormal findings: Secondary | ICD-10-CM

## 2013-09-29 DIAGNOSIS — M25511 Pain in right shoulder: Secondary | ICD-10-CM | POA: Insufficient documentation

## 2013-09-29 DIAGNOSIS — M25519 Pain in unspecified shoulder: Secondary | ICD-10-CM

## 2013-09-29 LAB — CBC
HCT: 39.3 % (ref 36.0–46.0)
HEMOGLOBIN: 13.1 g/dL (ref 12.0–15.0)
MCHC: 33.4 g/dL (ref 30.0–36.0)
MCV: 87.5 fl (ref 78.0–100.0)
Platelets: 356 10*3/uL (ref 150.0–400.0)
RBC: 4.49 Mil/uL (ref 3.87–5.11)
RDW: 12.8 % (ref 11.5–14.6)
WBC: 8.1 10*3/uL (ref 4.5–10.5)

## 2013-09-29 LAB — COMPREHENSIVE METABOLIC PANEL
ALBUMIN: 3.9 g/dL (ref 3.5–5.2)
ALK PHOS: 43 U/L (ref 39–117)
ALT: 15 U/L (ref 0–35)
AST: 22 U/L (ref 0–37)
BUN: 7 mg/dL (ref 6–23)
CO2: 26 meq/L (ref 19–32)
CREATININE: 0.9 mg/dL (ref 0.4–1.2)
Calcium: 8.9 mg/dL (ref 8.4–10.5)
Chloride: 104 mEq/L (ref 96–112)
GFR: 70.98 mL/min (ref >60.00)
Glucose, Bld: 113 mg/dL — ABNORMAL HIGH (ref 70–99)
Potassium: 3.6 mEq/L (ref 3.5–5.1)
Sodium: 136 mEq/L (ref 135–145)
Total Bilirubin: 0.5 mg/dL (ref 0.3–1.2)
Total Protein: 7.2 g/dL (ref 6.0–8.3)

## 2013-09-29 LAB — LIPID PANEL
CHOLESTEROL: 180 mg/dL (ref 0–200)
HDL: 33.8 mg/dL — ABNORMAL LOW (ref >39.00)
LDL Cholesterol: 118 mg/dL — ABNORMAL HIGH (ref 0–99)
Total CHOL/HDL Ratio: 5
Triglycerides: 141 mg/dL (ref 0.0–149.0)
VLDL: 28.2 mg/dL (ref 0.0–40.0)

## 2013-09-29 LAB — TSH: TSH: 1.2 u[IU]/mL (ref 0.35–5.50)

## 2013-09-29 LAB — HEMOGLOBIN A1C: HEMOGLOBIN A1C: 5.7 % (ref 4.6–6.5)

## 2013-09-29 MED ORDER — TRAMADOL HCL 50 MG PO TABS: 50.0000 mg | ORAL_TABLET | Freq: Three times a day (TID) | ORAL | Status: AC | PRN

## 2013-09-29 NOTE — Assessment & Plan Note (Signed)
Workmans comp case No longer seeing ortho Aleve ineffective for pain Will try tramadol

## 2013-09-29 NOTE — Progress Notes (Signed)
HPI Pt presents to the clinic today to establish care. She has not had a PCP since [place]. She does have some concerns today about her right shoulder. She did have a work related injury 11/2012. She did have surgery 03/2013 to repair her labrum. She has been in consistent pain since that time. Her surgeon told her they were not going to repeat her surgery. She does take Aleve for pain but it is not very effective.   Flu: never Tetanus: 1999 LMP: ablation Pap Smear: 2010 Mammogram: 2010 Eye Doctor: yearly Dentist: biannually  Past Medical History  Diagnosis Date  . Shoulder pain, right     Current Outpatient Prescriptions  Medication Sig Dispense Refill  . doxylamine, Sleep, (SLEEP AID) 25 MG tablet Take 25 mg by mouth at bedtime as needed.      . naproxen sodium (ANAPROX) 220 MG tablet Take 220 mg by mouth 2 (two) times daily as needed.       No current facility-administered medications for this visit.    Allergies  Allergen Reactions  . Codeine Sulfate     REACTION: N \\T \ V  . Hydromorphone Hcl     REACTION: Nausea and vomitting  . Penicillins     REACTION: Rash    Family History  Problem Relation Age of Onset  . Diabetes Father   . Heart disease Father   . Atrial fibrillation Father   . Breast cancer Maternal Grandmother   . Bone cancer Maternal Grandmother     History   Social History  . Marital Status: Single    Spouse Name: N/A    Number of Children: N/A  . Years of Education: N/A   Occupational History  . Not on file.   Social History Main Topics  . Smoking status: Never Smoker   . Smokeless tobacco: Never Used  . Alcohol Use: No  . Drug Use: No  . Sexual Activity: Not on file   Other Topics Concern  . Not on file   Social History Narrative  . No narrative on file    ROS:  Constitutional: Denies fever, malaise, fatigue, headache or abrupt weight changes.  HEENT: Denies eye pain, eye redness, ear pain, ringing in the ears, wax buildup,  runny nose, nasal congestion, bloody nose, or sore throat. Respiratory: Denies difficulty breathing, shortness of breath, cough or sputum production.   Cardiovascular: Denies chest pain, chest tightness, palpitations or swelling in the hands or feet.  Gastrointestinal: Denies abdominal pain, bloating, constipation, diarrhea or blood in the stool.  GU: Denies frequency, urgency, pain with urination, blood in urine, odor or discharge. Musculoskeletal: Pt reports right shoulder pain. Denies difficulty with gait, muscle pain or joint swelling.  Skin: Denies redness, rashes, lesions or ulcercations.  Neurological: Denies dizziness, difficulty with memory, difficulty with speech or problems with balance and coordination.   No other specific complaints in a complete review of systems (except as listed in HPI above).  PE:  BP 122/78  Pulse 74  Temp(Src) 98.9 F (37.2 C) (Oral)  Ht 5' 3.25" (1.607 m)  Wt 168 lb 12 oz (76.544 kg)  BMI 29.64 kg/m2  SpO2 98% Wt Readings from Last 3 Encounters:  09/29/13 168 lb 12 oz (76.544 kg)  04/17/10 167 lb 4 oz (75.864 kg)  03/16/10 169 lb 4 oz (76.771 kg)    General: Appears her stated age, well developed, well nourished in NAD. HEENT: Head: normal shape and size; Eyes: sclera white, no icterus, conjunctiva pink, PERRLA  and EOMs intact; Ears: Tm's gray and intact, normal light reflex; Nose: mucosa pink and moist, septum midline; Throat/Mouth: Teeth present, mucosa pink and moist, no lesions or ulcerations noted.  Neck: Normal range of motion. Neck supple, trachea midline. No massses, lumps or thyromegaly present.  Cardiovascular: Normal rate and rhythm. S1,S2 noted.  No murmur, rubs or gallops noted. No JVD or BLE edema. No carotid bruits noted. Pulmonary/Chest: Normal effort and positive vesicular breath sounds. No respiratory distress. No wheezes, rales or ronchi noted.  Abdomen: Soft and nontender. Normal bowel sounds, no bruits noted. No distention or  masses noted. Liver, spleen and kidneys non palpable. Musculoskeletal: Decreased internal and external rotation of the left shoulder. No signs of joint swelling. No difficulty with gait.  Neurological: Alert and oriented. Cranial nerves II-XII intact. Coordination normal. +DTRs bilaterally. Psychiatric: Mood and affect normal. Behavior is normal. Judgment and thought content normal.      Assessment and Plan:  Preventative Health Maintenance:  Will give Tdap today Encouraged her to work on diet and exercise She declines flu shot toda Will obtain screening labs today Will order mammogram today She will call back to schedule her pap smear

## 2013-09-29 NOTE — Patient Instructions (Addendum)

## 2013-09-29 NOTE — Addendum Note (Signed)
Addended by: Roena MaladyEVONTENNO, Jamarcus Laduke Y on: 09/29/2013 04:27 PM   Modules accepted: Orders

## 2013-09-29 NOTE — Progress Notes (Signed)
Pre visit review using our clinic review tool, if applicable. No additional management support is needed unless otherwise documented below in the visit note. 

## 2013-09-30 ENCOUNTER — Encounter: Payer: Self-pay | Admitting: Internal Medicine

## 2013-10-01 ENCOUNTER — Other Ambulatory Visit (HOSPITAL_COMMUNITY)
Admission: RE | Admit: 2013-10-01 | Discharge: 2013-10-01 | Disposition: A | Discharge status: Home / Self Care | Payer: BC Managed Care – PPO | Source: Ambulatory Visit | Attending: Internal Medicine | Admitting: Internal Medicine

## 2013-10-01 ENCOUNTER — Ambulatory Visit (INDEPENDENT_AMBULATORY_CARE_PROVIDER_SITE_OTHER): Payer: BC Managed Care – PPO | Admitting: Internal Medicine

## 2013-10-01 ENCOUNTER — Encounter: Payer: Self-pay | Admitting: Internal Medicine

## 2013-10-01 VITALS — BP 106/78 | HR 90 | Temp 98.6°F | Wt 169.2 lb

## 2013-10-01 DIAGNOSIS — Z01419 Encounter for gynecological examination (general) (routine) without abnormal findings: Secondary | ICD-10-CM | POA: Insufficient documentation

## 2013-10-01 DIAGNOSIS — Z124 Encounter for screening for malignant neoplasm of cervix: Secondary | ICD-10-CM

## 2013-10-01 DIAGNOSIS — Z1151 Encounter for screening for human papillomavirus (HPV): Secondary | ICD-10-CM | POA: Insufficient documentation

## 2013-10-01 NOTE — Addendum Note (Signed)
Addended by: Roena MaladyEVONTENNO, Carlene Bickley Y on: 10/01/2013 09:49 AM   Modules accepted: Orders

## 2013-10-01 NOTE — Patient Instructions (Addendum)
Pap Test A Pap test checks the cells on the surface of your cervix. Your doctor will look for cell changes that are not normal, an infection, or cancer. If the cells no longer look normal, it is called dysplasia. Dysplasia can turn into cancer. Regular Pap tests are important to stop cancer from developing. BEFORE THE PROCEDURE  Ask your doctor when to schedule your Pap test. Timing the test around your period may be important.  Do not douche or have sex (intercourse) for 24 hours before the test.  Do not put creams on your vagina or use tampons for 24 hours before the test.  Go pee (urinate) just before the test. PROCEDURE  You will lie on an exam table with your feet in stirrups.  A warm metal or plastic tool (speculum) will be put in your vagina to open it up.  Your doctor will use a small, plastic brush or wooden spatula to take cells from your cervix.  The cells will be put in a lab container.  The cells will be checked under a microscope to see if they are normal or not. AFTER THE PROCEDURE Get your test results. If they are abnormal, you may need more tests. Document Released: 08/10/2010 Document Revised: 09/30/2011 Document Reviewed: 07/04/2011 [employer] Patient Information 2014 [employer].  

## 2013-10-01 NOTE — Progress Notes (Signed)
Subjective:    Patient ID: Leslie Mcguire, female    DOB: [birth year], 48 y.o.   MRN: [id number]  HPI  Pt presents to the clinic today for her pap smear. Her last pap smear was in 2010. She does have a history of endometriosis s/p uterine ablation.  Review of Systems      Past Medical History  Diagnosis Date  . Shoulder pain, right     Current Outpatient Prescriptions  Medication Sig Dispense Refill  . doxylamine, Sleep, (SLEEP AID) 25 MG tablet Take 25 mg by mouth at bedtime as needed.      . naproxen sodium (ANAPROX) 220 MG tablet Take 220 mg by mouth 2 (two) times daily as needed.      . traMADol (ULTRAM) 50 MG tablet Take 1 tablet (50 mg total) by mouth every 8 (eight) hours as needed.  60 tablet  0   No current facility-administered medications for this visit.    Allergies  Allergen Reactions  . Codeine Sulfate     REACTION: N \\T \ V  . Hydromorphone Hcl     REACTION: Nausea and vomitting  . Penicillins     REACTION: Rash    Family History  Problem Relation Age of Onset  . Diabetes Father   . Heart disease Father   . Atrial fibrillation Father   . Breast cancer Maternal Grandmother   . Bone cancer Maternal Grandmother     History   Social History  . Marital Status: Single    Spouse Name: N/A    Number of Children: N/A  . Years of Education: N/A   Occupational History  . Not on file.   Social History Main Topics  . Smoking status: Never Smoker   . Smokeless tobacco: Never Used  . Alcohol Use: No  . Drug Use: No  . Sexual Activity: Yes   Other Topics Concern  . Not on file   Social History Narrative  . No narrative on file     Constitutional: Denies fever, malaise, fatigue, headache or abrupt weight changes.  Gastrointestinal: Denies abdominal pain, bloating, constipation, diarrhea or blood in the stool.  GU: Denies urgency, frequency, pain with urination, burning sensation, blood in urine, odor or discharge.   No other specific  complaints in a complete review of systems (except as listed in HPI above).  Objective:   Physical Exam  BP 106/78  Pulse 90  Temp(Src) 98.6 F (37 C) (Oral)  Wt 169 lb 4 oz (76.771 kg)  SpO2 97% Wt Readings from Last 3 Encounters:  10/01/13 169 lb 4 oz (76.771 kg)  09/29/13 168 lb 12 oz (76.544 kg)  04/17/10 167 lb 4 oz (75.864 kg)    Constitutional:  Alert, oriented x 4, well developed, well nourished in no apparent distress. Cardiovascular: Normal rate and rhythm. S1,S2 noted.  No murmur, rubs or gallops noted. No JVD or BLE edema. No carotid bruits noted. Pulmonary/Chest: Normal effort and positive vesicular breath sounds. No respiratory distress. No wheezes, rales or ronchi noted.  Abdomen: Soft and nontender. Normal bowel sounds, no bruits noted. No distention or masses noted. Liver, spleen and kidneys non palpable. Genitourinary: Normal female anatomy. Uterus midline, anterior and soft. No CMT, small amount of pale yellow discharge noted from the cervical os. [place] non palpable. Breast without lumps or masses.     BMET    Component Value Date/Time   NA 136 09/29/2013 1419   K 3.6 09/29/2013 1419   CL 104  09/29/2013 1419   CO2 26 09/29/2013 1419   GLUCOSE 113* 09/29/2013 1419   BUN 7 09/29/2013 1419   CREATININE 0.9 09/29/2013 1419   CALCIUM 8.9 09/29/2013 1419    Lipid Panel     Component Value Date/Time   CHOL 180 09/29/2013 1419   TRIG 141.0 09/29/2013 1419   HDL 33.80* 09/29/2013 1419   CHOLHDL 5 09/29/2013 1419   VLDL 28.2 09/29/2013 1419   LDLCALC 118* 09/29/2013 1419    CBC    Component Value Date/Time   WBC 8.1 09/29/2013 1419   RBC 4.49 09/29/2013 1419   HGB 13.1 09/29/2013 1419   HCT 39.3 09/29/2013 1419   PLT 356.0 09/29/2013 1419   MCV 87.5 09/29/2013 1419   MCHC 33.4 09/29/2013 1419   RDW 12.8 09/29/2013 1419    Hgb A1C Lab Results  Component Value Date   HGBA1C 5.7 09/29/2013         Assessment & Plan:   Screening for cervical cancer with  routine gyn exam:  Pap smear obtained today Will send off for processing and call you with the results  RTC in 1 year or sooner if needed

## 2013-10-01 NOTE — Progress Notes (Signed)
Pre visit review using our clinic review tool, if applicable. No additional management support is needed unless otherwise documented below in the visit note. 

## 2014-07-04 ENCOUNTER — Telehealth: Payer: Self-pay

## 2014-07-04 NOTE — Telephone Encounter (Signed)
Called Dr. Marina GoodellPerry back, questions answered

## 2014-07-04 NOTE — Telephone Encounter (Signed)
Dr Rozanna BoerKay Perry left v/m; pt is scheduled later this week for dental surgery and Dr Marina GoodellPerry request cb with medications pt is presently taking and any health issues pt might have. Dr Marina GoodellPerry request cb ASAP.

## 2020-02-22 ENCOUNTER — Telehealth: Payer: Self-pay

## 2020-02-22 NOTE — Telephone Encounter (Signed)
I saw her for 2 acute visits in 2011, none since.  She since actually established with [facility] in 2015, not me. rec schedule CPE with Rene Kocher.

## 2020-02-22 NOTE — Telephone Encounter (Signed)
Patient's contacted the office. She states she is needing a CPE - it looks like the last time patient was seen was in 2015. I advised that due to this, she would be considered a new patient. I advised that Dr. Reece Agar is currently not taking new patients at this time, but she states she would like to come back to see Dr. Reece Agar if at all possible. I advised her that I would send a message to see if he would make an exception. Please advise.

## 2020-02-23 NOTE — Telephone Encounter (Signed)
Contacted pt and scheduled with [facility] for 9/22. Pt verbalized understanding.

## 2020-04-12 ENCOUNTER — Ambulatory Visit: Payer: Self-pay | Admitting: Internal Medicine

## 2020-05-01 ENCOUNTER — Ambulatory Visit: Payer: Self-pay | Admitting: Physician Assistant

## 2020-05-25 ENCOUNTER — Ambulatory Visit: Payer: Managed Care, Other (non HMO) | Admitting: Physician Assistant
